# Patient Record
Sex: Female | Born: 1990 | Race: Black or African American | Hispanic: No | Marital: Single | Smoking: Current every day smoker
Health system: Southern US, Community
[De-identification: ages and names within clinical notes are randomized; demographics above are authoritative.]

## PROBLEM LIST (undated history)

## (undated) HISTORY — PX: ADENOIDECTOMY: SHX5191

## (undated) HISTORY — PX: TONSILLECTOMY: SUR1361

---

## 2013-12-15 ENCOUNTER — Encounter (HOSPITAL_COMMUNITY): Payer: Self-pay | Admitting: Emergency Medicine

## 2013-12-15 DIAGNOSIS — IMO0001 Reserved for inherently not codable concepts without codable children: Secondary | ICD-10-CM | POA: Insufficient documentation

## 2013-12-15 DIAGNOSIS — F17200 Nicotine dependence, unspecified, uncomplicated: Secondary | ICD-10-CM | POA: Insufficient documentation

## 2013-12-15 DIAGNOSIS — R51 Headache: Secondary | ICD-10-CM | POA: Insufficient documentation

## 2013-12-15 DIAGNOSIS — J309 Allergic rhinitis, unspecified: Secondary | ICD-10-CM | POA: Insufficient documentation

## 2013-12-15 DIAGNOSIS — Z79899 Other long term (current) drug therapy: Secondary | ICD-10-CM | POA: Insufficient documentation

## 2013-12-15 DIAGNOSIS — F1410 Cocaine abuse, uncomplicated: Secondary | ICD-10-CM | POA: Insufficient documentation

## 2013-12-15 NOTE — ED Notes (Signed)
Pt. reports generalized weakness , occasional productive cough and mild headache onset yesterday , denies fever or chills , respirations unlabored / ambulatory. Denies emesis /diarrhea or body aches.

## 2013-12-16 ENCOUNTER — Emergency Department (HOSPITAL_COMMUNITY): Payer: Self-pay

## 2013-12-16 ENCOUNTER — Emergency Department (HOSPITAL_COMMUNITY)
Admission: EM | Admit: 2013-12-16 | Discharge: 2013-12-16 | Disposition: A | Discharge status: Home / Self Care | Payer: Self-pay | Attending: Emergency Medicine | Admitting: Emergency Medicine

## 2013-12-16 DIAGNOSIS — J302 Other seasonal allergic rhinitis: Secondary | ICD-10-CM

## 2013-12-16 DIAGNOSIS — R51 Headache: Secondary | ICD-10-CM

## 2013-12-16 DIAGNOSIS — R519 Headache, unspecified: Secondary | ICD-10-CM

## 2013-12-16 MED ORDER — OXYMETAZOLINE HCL 0.05 % NA SOLN
1.0000 | Freq: Once | NASAL | Status: AC
  Administered 2013-12-16: 1 via NASAL
  Filled 2013-12-16: qty 15

## 2013-12-16 MED ORDER — BENZONATATE 100 MG PO CAPS: 100.0000 mg | ORAL_CAPSULE | Freq: Three times a day (TID) | ORAL | Status: AC

## 2013-12-16 MED ORDER — IBUPROFEN 600 MG PO TABS: 600.0000 mg | ORAL_TABLET | Freq: Four times a day (QID) | ORAL | Status: AC | PRN

## 2013-12-16 MED ORDER — IBUPROFEN 400 MG PO TABS
600.0000 mg | ORAL_TABLET | Freq: Once | ORAL | Status: AC
  Administered 2013-12-16: 600 mg via ORAL
  Filled 2013-12-16 (×2): qty 1

## 2013-12-16 MED ORDER — MOMETASONE FUROATE 50 MCG/ACT NA SUSP: 2.0000 | Freq: Every day | NASAL | Status: AC

## 2013-12-16 MED ORDER — LORATADINE 10 MG PO TABS: 10.0000 mg | ORAL_TABLET | Freq: Every day | ORAL | Status: AC

## 2013-12-16 MED ORDER — LORATADINE 10 MG PO TABS
10.0000 mg | ORAL_TABLET | Freq: Once | ORAL | Status: AC
  Administered 2013-12-16: 10 mg via ORAL
  Filled 2013-12-16: qty 1

## 2013-12-16 NOTE — ED Provider Notes (Signed)
CSN: [id number]     Arrival date & time 12/15/13  2338 History   First MD Initiated Contact with Patient 12/16/13 0309     Chief Complaint  Patient presents with  . Weakness  . Cough  . Headache     (Consider location/radiation/quality/duration/timing/severity/associated sxs/prior Treatment) HPI Patient presents with nasal congestion, frontal sinus pressure, cough, diffuse body aches for 2-3 days. She been taking Tylenol at home with little relief. Denies history of seasonal allergies. No sick contacts. No fever or chills. No shortness of breath. No wheezing. No new exposures. History reviewed. No pertinent past medical history. Past Surgical History  Procedure Laterality Date  . Tonsillectomy    . Adenoidectomy     No family history on file. History  Substance Use Topics  . Smoking status: Current Every Day Smoker  . Smokeless tobacco: Not on file  . Alcohol Use: Yes   OB History   Grav Para Term Preterm Abortions TAB SAB Ect Mult Living                 Review of Systems  Constitutional: Positive for fatigue. Negative for fever and chills.  HENT: Positive for congestion and sinus pressure. Negative for ear pain, hearing loss and sore throat.   Eyes: Negative for visual disturbance.  Respiratory: Positive for cough. Negative for shortness of breath and wheezing.   Cardiovascular: Negative for chest pain.  Gastrointestinal: Negative for nausea, vomiting, abdominal pain and diarrhea.  Musculoskeletal: Positive for myalgias. Negative for arthralgias, back pain, neck pain and neck stiffness.  Skin: Negative for rash.  Neurological: Positive for headaches. Negative for dizziness, weakness, light-headedness and numbness.  All other systems reviewed and are negative.     Allergies  Review of patient's allergies indicates no known allergies.  Home Medications   Prior to Admission medications   Medication Sig Start Date End Date Taking? Authorizing Provider  acetaminophen  (TYLENOL) 325 MG tablet Take 1,300 mg by mouth 2 (two) times daily as needed for headache.   Yes Historical Provider, MD   BP 133/65  Pulse 106  Temp(Src) 98.2 F (36.8 C) (Oral)  Resp 16  SpO2 94%  LMP 11/19/2013 Physical Exam  Nursing note and vitals reviewed. Constitutional: She is oriented to person, place, and time. She appears well-developed and well-nourished. No distress.  HENT:  Head: Normocephalic and atraumatic.  Mouth/Throat: Oropharynx is clear and moist.  Bilateral nasal mucosal edema. Patient has right frontal sinus tenderness with percussion. Oropharynx is clear.  Eyes: EOM are normal. Pupils are equal, round, and reactive to light.  Neck: Normal range of motion. Neck supple.  No meningismus  Cardiovascular: Normal rate and regular rhythm.   Pulmonary/Chest: Effort normal and breath sounds normal. No respiratory distress. She has no wheezes. She has no rales. She exhibits no tenderness.  Abdominal: Soft. Bowel sounds are normal. She exhibits no distension and no mass. There is no tenderness. There is no rebound and no guarding.  Musculoskeletal: Normal range of motion. She exhibits no edema and no tenderness.  Neurological: She is alert and oriented to person, place, and time.  Patient is alert and oriented x3 with clear, goal oriented speech. Patient has 5/5 motor in all extremities. Sensation is intact to light touch. Bilateral finger-to-nose is normal with no signs of dysmetria. Patient has a normal gait and walks without assistance.   Skin: Skin is warm and dry. No rash noted. No erythema.  Psychiatric: She has a normal mood and affect. Her behavior  is normal.    ED Course  Procedures (including critical care time) Labs Review Labs Reviewed - No data to display  Imaging Review No results found.   EKG Interpretation None      MDM   Final diagnoses:  None    Symptoms consistent with URI vs seasonal allergies. Headache is due to a frontal sinusitis.  No red flag symptoms. Normal neurologic exam.    Loren Racer, MD 12/16/13 [phone]

## 2013-12-16 NOTE — Discharge Instructions (Signed)
·  °Allergic Rhinitis °Allergic rhinitis is when the mucous membranes in the nose respond to allergens. Allergens are particles in the air that cause your body to have an allergic reaction. This causes you to release allergic antibodies. Through a chain of events, these eventually cause you to release histamine into the blood stream. Although meant to protect the body, it is this release of histamine that causes your discomfort, such as frequent sneezing, congestion, and an itchy, runny nose.  °CAUSES  °Seasonal allergic rhinitis (hay fever) is caused by pollen allergens that may come from grasses, trees, and weeds. Year-round allergic rhinitis (perennial allergic rhinitis) is caused by allergens such as house dust mites, pet dander, and mold spores.  °SYMPTOMS  °Nasal stuffiness (congestion). °Itchy, runny nose with sneezing and tearing of the eyes. °DIAGNOSIS  °Your health care provider can help you determine the allergen or allergens that trigger your symptoms. If you and your health care provider are unable to determine the allergen, skin or blood testing may be used. °TREATMENT  °Allergic rhinitis does not have a cure, but it can be controlled by: °Medicines and allergy shots (immunotherapy). °Avoiding the allergen. °Hay fever may often be treated with antihistamines in pill or nasal spray forms. Antihistamines block the effects of histamine. There are over-the-counter medicines that may help with nasal congestion and swelling around the eyes. Check with your health care provider before taking or giving this medicine.  °If avoiding the allergen or the medicine prescribed do not work, there are many new medicines your health care provider can prescribe. Stronger medicine may be used if initial measures are ineffective. Desensitizing injections can be used if medicine and avoidance does not work. Desensitization is when a patient is given ongoing shots until the body becomes less sensitive to the allergen. Make  sure you follow up with your health care provider if problems continue. °HOME CARE INSTRUCTIONS °It is not possible to completely avoid allergens, but you can reduce your symptoms by taking steps to limit your exposure to them. It helps to know exactly what you are allergic to so that you can avoid your specific triggers. °SEEK MEDICAL CARE IF:  °You have a fever. °You develop a cough that does not stop easily (persistent). °You have shortness of breath. °You start wheezing. °Symptoms interfere with normal daily activities. °Document Released: 12/05/2000 Document Revised: 03/17/2013 Document Reviewed: 11/17/2012 °[employer]® Patient Information ©2015 [employer]. This information is not intended to replace advice given to you by your health care provider. Make sure you discuss any questions you have with your health care provider. ° °Sinus Headache °A sinus headache is when your sinuses become clogged or swollen. Sinus headaches can range from mild to severe.  °CAUSES °A sinus headache can have different causes, such as: °· Colds. °· Sinus infections. °· Allergies. °SYMPTOMS  °Symptoms of a sinus headache may vary and can include: °· Headache. °· Pain or pressure in the face. °· Congested or runny nose. °· Fever. °· Inability to smell. °· Pain in upper teeth. °Weather changes can make symptoms worse. °TREATMENT  °The treatment of a sinus headache depends on the cause. °· Sinus pain caused by a sinus infection may be treated with antibiotic medicine. °· Sinus pain caused by allergies may be helped by allergy medicines (antihistamines) and medicated nasal sprays. °· Sinus pain caused by congestion may be helped by flushing the nose and sinuses with saline solution. °HOME CARE INSTRUCTIONS  °· If antibiotics are prescribed, take them as   directed. Finish them even if you start to feel better. °· Only take over-the-counter or prescription medicines for pain, discomfort, or fever as directed by your caregiver. °· If you  have congestion, use a nasal spray to help reduce pressure. °SEEK IMMEDIATE MEDICAL CARE IF: °· You have a fever. °· You have headaches more than once a week. °· You have sensitivity to light or sound. °· You have repeated nausea and vomiting. °· You have vision problems. °· You have sudden, severe pain in your face or head. °· You have a seizure. °· You are confused. °· Your sinus headaches do not get better after treatment. Many people think they have a sinus headache when they actually have migraines or tension headaches. °MAKE SURE YOU:  °· Understand these instructions. °· Will watch your condition. °· Will get help right away if you are not doing well or get worse. °Document Released: 04/19/2004 Document Revised: 06/04/2011 Document Reviewed: 06/10/2010 °[employer]® Patient Information ©2015 [employer]. This information is not intended to replace advice given to you by your health care provider. Make sure you discuss any questions you have with your health care provider. ° ° °

## 2013-12-16 NOTE — ED Notes (Signed)
Pt c/o headache x 2 days.  Pain described as throbbing and pressure, worse when standing up, walking, coughing, etc.  Pt reports intermittent numbness in hands.  Pt also c/o generalized weakness.  Pt reports cough x 3 days, at times productive, with brown sputum.  NAD, respirations equal and unlabored, skin warm and dry.

## 2013-12-16 NOTE — ED Notes (Signed)
NAD at this time.  

## 2015-11-22 IMAGING — CR DG CHEST 2V
2 series · 2 of 2 positions shown · non-contrast
Comparison: None.

CLINICAL DATA: Weakness, cough.

EXAM:
CHEST  2 VIEW

[w chest pa]
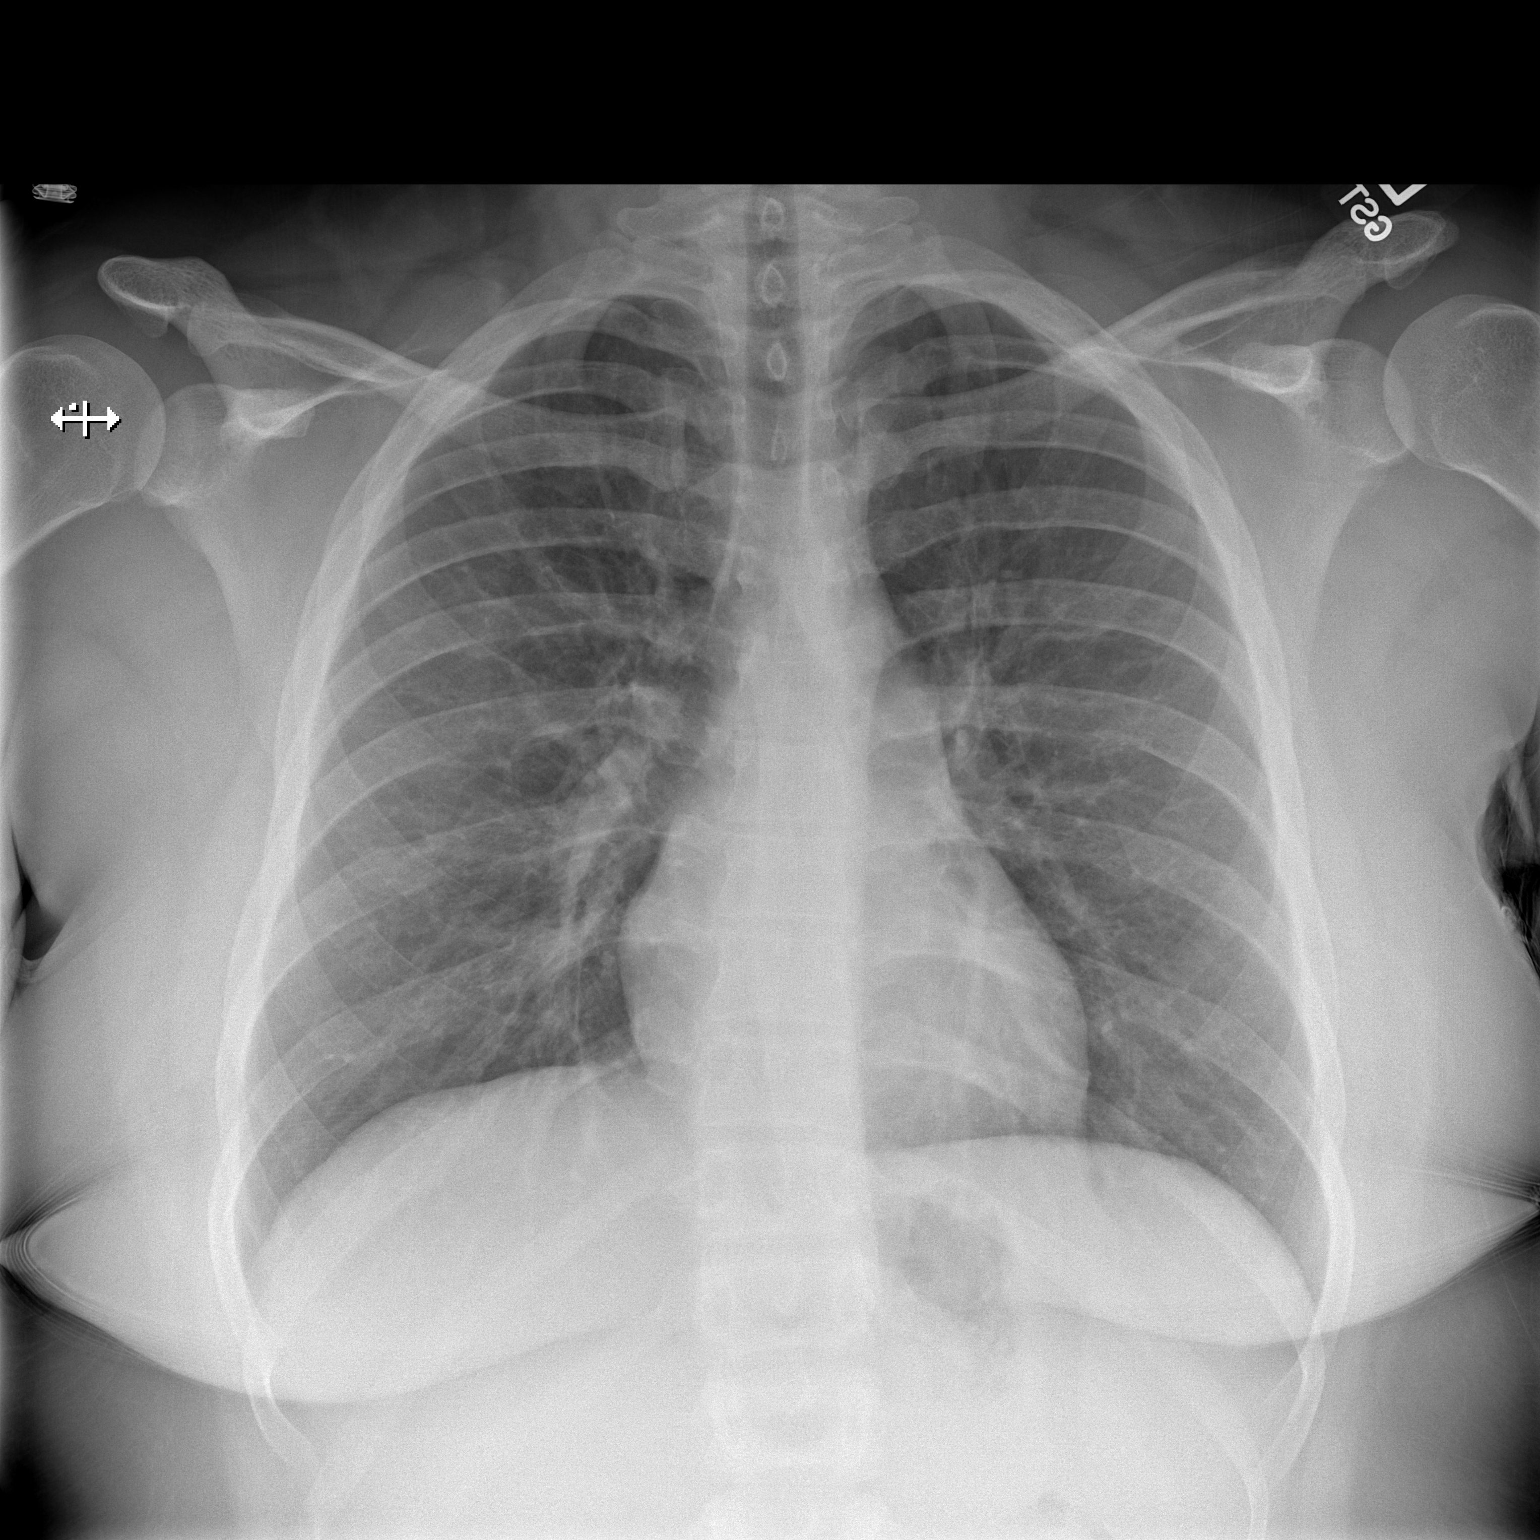

[w chest lat]
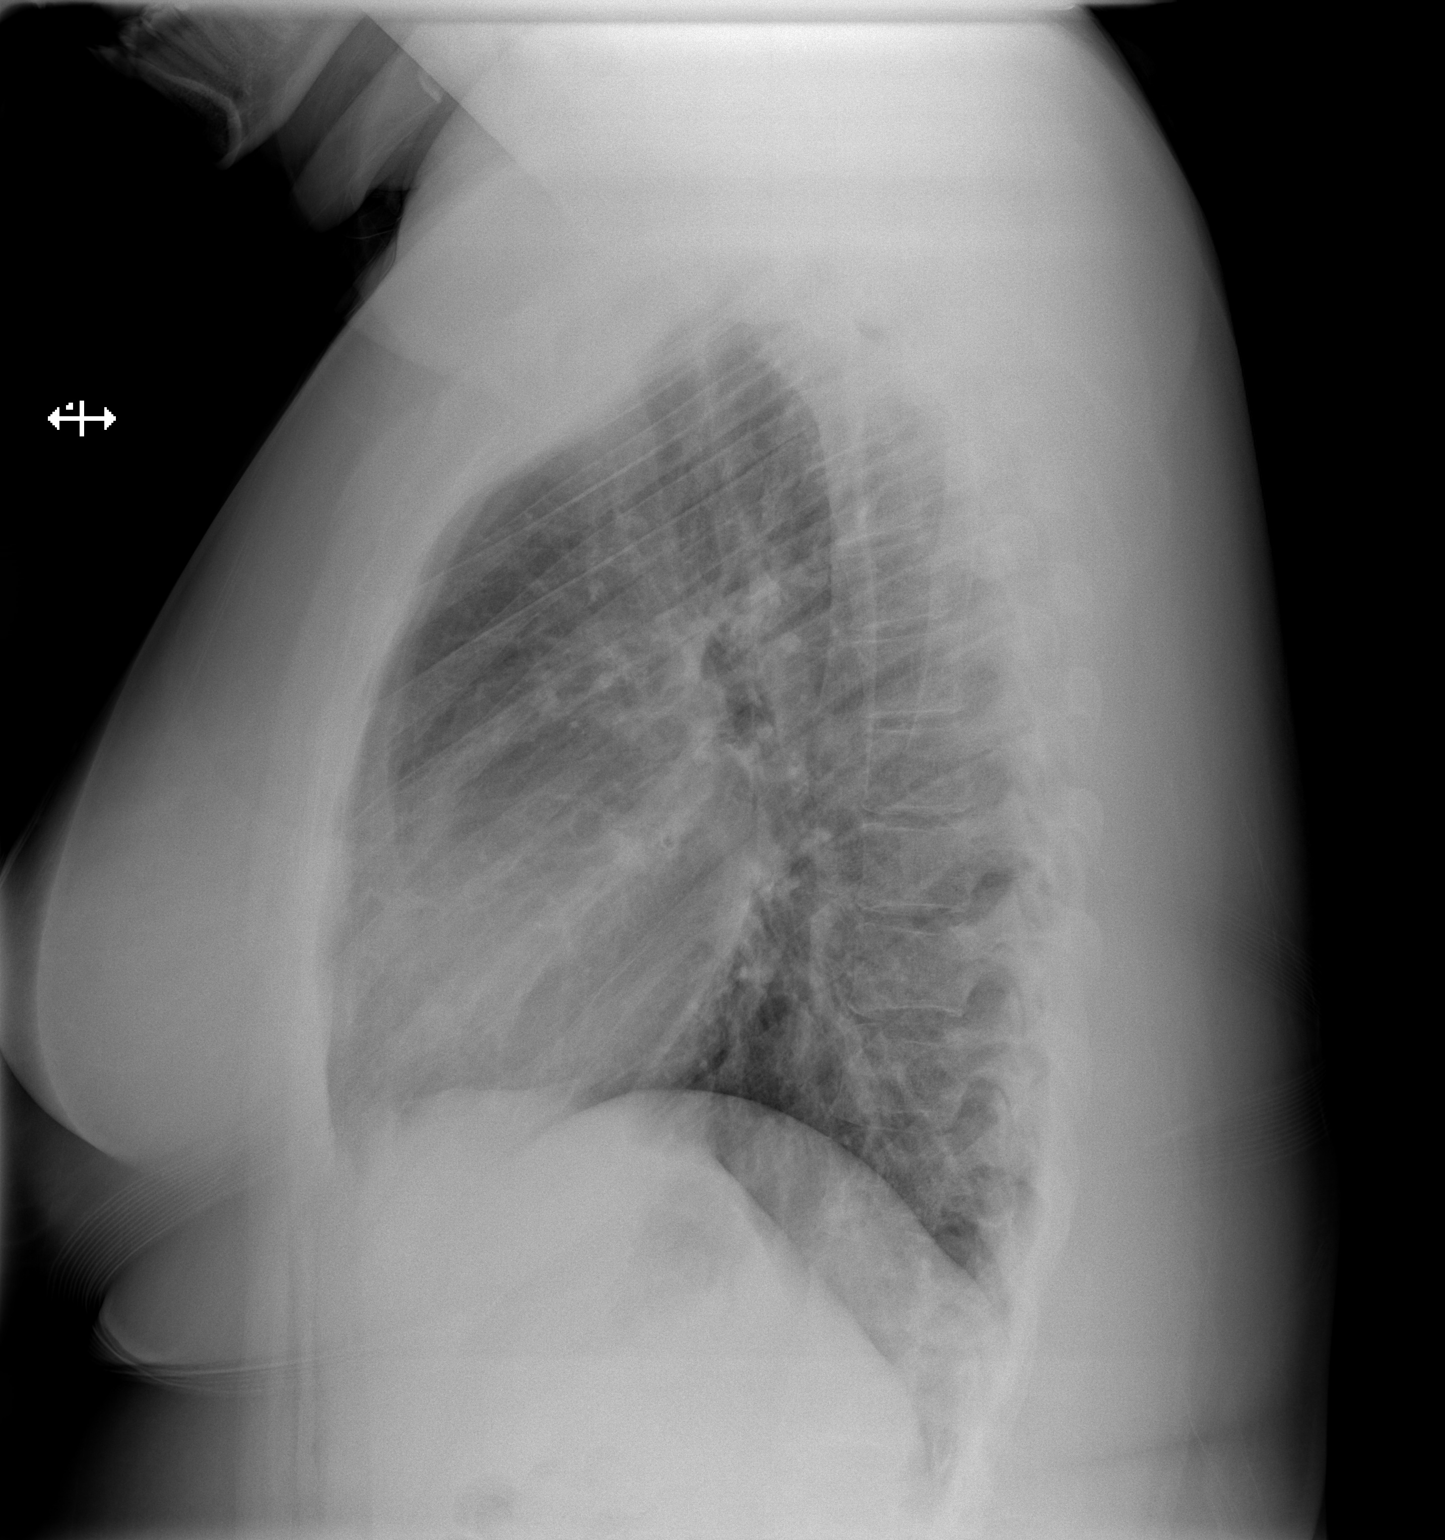

[2 of 2 positions shown; findings below may reference images not displayed]

FINDINGS: The cardiac and mediastinal silhouettes are within normal limits.

The lungs are normally inflated. No airspace consolidation, pleural
effusion, or pulmonary edema is identified. There is no
pneumothorax.

No acute osseous abnormality identified.
IMPRESSION: No active cardiopulmonary disease.
